# Patient Record
Sex: Male | Born: 1995 | Race: White | Hispanic: No | Marital: Single | Smoking: Never smoker
Health system: Southern US, Community
[De-identification: ages and names within clinical notes are randomized; demographics above are authoritative.]

## PROBLEM LIST (undated history)

## (undated) DIAGNOSIS — J45909 Unspecified asthma, uncomplicated: Secondary | ICD-10-CM

## (undated) DIAGNOSIS — G43D0 Abdominal migraine, not intractable: Secondary | ICD-10-CM

## (undated) DIAGNOSIS — L709 Acne, unspecified: Secondary | ICD-10-CM

## (undated) DIAGNOSIS — K219 Gastro-esophageal reflux disease without esophagitis: Secondary | ICD-10-CM

## (undated) HISTORY — PX: OTHER SURGICAL HISTORY: SHX169

## (undated) HISTORY — PX: ADENOIDECTOMY: SUR15

## (undated) HISTORY — PX: MYRINGOTOMY: SUR874

## (undated) HISTORY — DX: Unspecified asthma, uncomplicated: J45.909

## (undated) HISTORY — DX: Gastro-esophageal reflux disease without esophagitis: K21.9

---
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
## 2016-07-03 ENCOUNTER — Ambulatory Visit (INDEPENDENT_AMBULATORY_CARE_PROVIDER_SITE_OTHER): Payer: Managed Care, Other (non HMO) | Admitting: Family Medicine

## 2016-07-03 ENCOUNTER — Encounter: Payer: Self-pay | Admitting: Family Medicine

## 2016-07-03 VITALS — BP 118/68 | HR 83 | Temp 98.3°F | Ht 70.0 in | Wt 167.2 lb

## 2016-07-03 DIAGNOSIS — M542 Cervicalgia: Secondary | ICD-10-CM | POA: Insufficient documentation

## 2016-07-03 DIAGNOSIS — S161XXA Strain of muscle, fascia and tendon at neck level, initial encounter: Secondary | ICD-10-CM | POA: Diagnosis not present

## 2016-07-03 DIAGNOSIS — K219 Gastro-esophageal reflux disease without esophagitis: Secondary | ICD-10-CM

## 2016-07-03 DIAGNOSIS — G43009 Migraine without aura, not intractable, without status migrainosus: Secondary | ICD-10-CM | POA: Diagnosis not present

## 2016-07-03 DIAGNOSIS — F951 Chronic motor or vocal tic disorder: Secondary | ICD-10-CM

## 2016-07-03 DIAGNOSIS — Z8709 Personal history of other diseases of the respiratory system: Secondary | ICD-10-CM

## 2016-07-03 DIAGNOSIS — R3915 Urgency of urination: Secondary | ICD-10-CM | POA: Diagnosis not present

## 2016-07-03 LAB — POC URINALSYSI DIPSTICK (AUTOMATED)
Bilirubin, UA: NEGATIVE
Blood, UA: NEGATIVE
Glucose, UA: NEGATIVE
Ketones, UA: NEGATIVE
Leukocytes, UA: NEGATIVE
NITRITE UA: NEGATIVE
PROTEIN UA: NEGATIVE
SPEC GRAV UA: 1.025
UROBILINOGEN UA: 0.2
pH, UA: 7

## 2016-07-03 MED ORDER — MELOXICAM 15 MG PO TABS: 15.0000 mg | ORAL_TABLET | Freq: Every day | ORAL | 0 refills | Status: DC

## 2016-07-03 MED ORDER — CYCLOBENZAPRINE HCL 10 MG PO TABS: 5.0000 mg | ORAL_TABLET | Freq: Two times a day (BID) | ORAL | 0 refills | Status: DC | PRN

## 2016-07-03 MED ORDER — CYCLOBENZAPRINE HCL 10 MG PO TABS: 5.0000 mg | ORAL_TABLET | Freq: Every evening | ORAL | 0 refills | Status: DC | PRN

## 2016-07-03 NOTE — Assessment & Plan Note (Signed)
Discussed options. Consider referral to neuro for further treatment if stress reduction not helping.

## 2016-07-03 NOTE — Progress Notes (Signed)
Subjective:    Patient ID: Micheal Flores, male    DOB: 12-23-1995, 21 y.o.   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  PCP: [facility].  He has not been in last 3 years.   Dx by neurologist as child with Tourette's syndrome or motor tic disorder.  Occ slight jerk in past... Worse in last 2-3 months.  No vocal tic, no other movement ticks except occ blinking tics.  Now working and going to school full time.  Doing it several times an hour for 7-8 reps each btime.  Panning a wedding.  Has uncontrollable head movements.. Rolling head.  It is bothering a lot more with recent stress.   He has noted soreness now in left anterior neck. Pain with stretching neck back.  Sharp pain in back of neck with the movement as well.  No radiation of pain to arms. No weakness, no numbness. Stiff in morning when wakes up.  Has stretching, tried motrin for pain and associated tension headache.    Hx of migraine.Marland Kitchen only every 2-3 months.   Drinks a lot of water. Diet: poor diet, lots of junk food. Trying to decrease fast food in diet. Exercise: active at work   Work: maintenance at apartment complex.  Social History /Family History/Past Medical History reviewed and updated if needed.  Blood pressure 118/68, pulse 83, temperature 98.3 F (36.8 C), temperature source Oral, height 5\' 10"  (1.778 m), weight 167 lb 4 oz (75.9 kg). Body mass index is 24 kg/m.   Review of Systems  Constitutional: Negative for fatigue and fever.  HENT: Negative for ear pain.   Eyes: Negative for pain.  Respiratory: Negative for cough and shortness of breath.   Cardiovascular: Negative for chest pain, palpitations and leg swelling.  Gastrointestinal: Negative for abdominal pain.  Genitourinary: Positive for urgency. Negative for decreased urine volume, discharge, dysuria, flank pain, hematuria, penile pain, scrotal  swelling and testicular pain.       Trouble holding urine for long, no incontinence, occ has to push out urine.  Musculoskeletal: Negative for arthralgias.  Neurological: Positive for headaches. Negative for syncope and light-headedness.  Psychiatric/Behavioral: Negative for dysphoric mood.       Objective:   Physical Exam  Constitutional: He appears well-developed and well-nourished.  Non-toxic appearance. He does not appear ill. No distress.  HENT:  Head: Normocephalic and atraumatic.  Right Ear: Hearing, tympanic membrane, external ear and ear canal normal.  Left Ear: Hearing, tympanic membrane, external ear and ear canal normal.  Nose: Nose normal.  Mouth/Throat: Uvula is midline, oropharynx is clear and moist and mucous membranes are normal.  Eyes: Conjunctivae, EOM and lids are normal. Pupils are equal, round, and reactive to light. Lids are everted and swept, no foreign bodies found.  Neck: Trachea normal and phonation normal. Neck supple. Muscular tenderness present. No spinous process tenderness present. Carotid bruit is not present. Decreased range of motion present. No Brudzinski's sign and no Kernig's sign noted. No thyroid mass and no thyromegaly present.  Cardiovascular: Normal rate, regular rhythm, S1 normal, S2 normal, intact distal pulses and normal pulses.  Exam reveals no gallop.   No murmur heard. Pulmonary/Chest: Breath sounds normal. He has no wheezes. He has no rhonchi. He has no rales.  Abdominal: Soft. Normal appearance and bowel sounds are normal. There is no hepatosplenomegaly. There is no tenderness. There is  no rebound, no guarding and no CVA tenderness. No hernia.  Musculoskeletal:       Left shoulder: Normal.       Cervical back: He exhibits decreased range of motion and tenderness. He exhibits no bony tenderness and no swelling.  Lymphadenopathy:    He has no cervical adenopathy.  Neurological: He is alert. He has normal strength and normal reflexes. No  cranial nerve deficit or sensory deficit. Gait normal.  Skin: Skin is warm, dry and intact. No rash noted.  Psychiatric: He has a normal mood and affect. His speech is normal and behavior is normal. Judgment normal.          Assessment & Plan:

## 2016-07-03 NOTE — Addendum Note (Signed)
Addended by: Carter Kitten on: 07/03/2016 02:31 PM   Modules accepted: Orders

## 2016-07-03 NOTE — Progress Notes (Signed)
Pre visit review using our clinic review tool, if applicable. No additional management support is needed unless otherwise documented below in the visit note. 

## 2016-07-03 NOTE — Assessment & Plan Note (Signed)
Occurs every few weeks from food trigger. Using meds prn.

## 2016-07-03 NOTE — Assessment & Plan Note (Signed)
Treat with muscle relaxant prn, NSAIDs and home PT. Call if recurring for further options.

## 2016-07-03 NOTE — Patient Instructions (Addendum)
Working on healthy eating and  regular exercise.  Start muscle relaxant at night, use  meloxicam daily for pain and inflammation.  Start home PT.  Consider neuro referral for further treatment options for  Tourette's syndrome.

## 2016-07-03 NOTE — Assessment & Plan Note (Signed)
Occ every 2-3 months.

## 2016-10-09 ENCOUNTER — Ambulatory Visit (INDEPENDENT_AMBULATORY_CARE_PROVIDER_SITE_OTHER): Payer: Managed Care, Other (non HMO) | Admitting: Family Medicine

## 2016-10-09 ENCOUNTER — Encounter: Payer: Self-pay | Admitting: Family Medicine

## 2016-10-09 ENCOUNTER — Ambulatory Visit (INDEPENDENT_AMBULATORY_CARE_PROVIDER_SITE_OTHER)
Admission: RE | Admit: 2016-10-09 | Discharge: 2016-10-09 | Disposition: A | Discharge status: Home / Self Care | Payer: Managed Care, Other (non HMO) | Source: Ambulatory Visit | Attending: Family Medicine | Admitting: Family Medicine

## 2016-10-09 ENCOUNTER — Telehealth: Payer: Self-pay

## 2016-10-09 VITALS — BP 118/76 | HR 65 | Ht 70.0 in | Wt 179.0 lb

## 2016-10-09 DIAGNOSIS — R079 Chest pain, unspecified: Secondary | ICD-10-CM

## 2016-10-09 DIAGNOSIS — R0600 Dyspnea, unspecified: Secondary | ICD-10-CM | POA: Insufficient documentation

## 2016-10-09 MED ORDER — ALBUTEROL SULFATE HFA 108 (90 BASE) MCG/ACT IN AERS: 2.0000 | INHALATION_SPRAY | Freq: Four times a day (QID) | RESPIRATORY_TRACT | 2 refills | Status: DC | PRN

## 2016-10-09 NOTE — Telephone Encounter (Signed)
pts mother called last wk pt woke up gasping for breath x 1. Last night pt woke up gasping for breath and could not feel lower body but pt could walk; now no symptoms but wants eval. Pt cannot afford to go to UC or ED. Pt to see Dr Darnell Level 10/09/16 at 9:30.

## 2016-10-09 NOTE — Patient Instructions (Addendum)
This could be sleep apnea or other sleep trouble. We will refer you to sleep doctor for further evaluation - but try albuterol inhaler first. See Rosaria Ferries on your way out. Xray today. Possible asthma - try albuterol inhaler next time you have flare.

## 2016-10-09 NOTE — Assessment & Plan Note (Signed)
Describes PND, ESS = 8.  Not truly consistent with anxiety attacks (only happens at night, no prior h/o anxiety). ?asthma - PF today = 69% predicted. Rx albuterol inhaler PRN episode. If effective, may continue PRN. I also encouraged he remove new dog from bed - could be contributing.  ?GERD related - encouraged back off spicy foods. If no improvement with albuterol inhaler, they will contact me for sleep MD referral. Pt/mom agree with plan.

## 2016-10-09 NOTE — Assessment & Plan Note (Signed)
Check CXR to r/p PTX.

## 2016-10-09 NOTE — Progress Notes (Signed)
BP 118/76   Pulse 65   Ht 5\' 10"  (1.778 m)   Wt 179 lb (81.2 kg)   SpO2 98%   PF 300 L/min   BMI 25.68 kg/m    CC: apnea Subjective:    Patient ID: Micheal Flores, male    DOB: 10-04-1995, 21 y.o.   MRN: [id number]  HPI: Micheal Flores is a 21 y.o. male presenting on 10/09/2016 for Apnea (Pt c/o waking up in the middle of the night gasping for air x 2 weeks.  He states it takes 2-3 hours to recover.)   Here with mom.  Waking up at night with dyspnea and trouble breathing over last 2 weeks, this has happened 3 times. This is associated with cotton mouth, lightheadedness, nausea, sharp pains in chest. Latest episode was last night.   He may snore at night. No witnessed apneic episodes. Non restorative sleep. Some daytime somnolence - has been taking naps during the day because of fatigue. Ongoing trouble sleeping over last 2 yrs.   Denies fevers/chills, cough, abd pain, viral URI sxs.   Denies inciting trauma, injury, falls.  He does have intermittent GERD - eats high spicy foods.   No personal h/o anxiety.  Mom and sister with h/o panic attacks.  Mom did have some sleep apnea.  Known h/o tourette's. Taking meloxicam and flexeril about once weekly. Actually feels tourette's doing well - stress levels overall down.   Works at [employer].   With mom out of room denies alcohol, smoking, rec drugs.   H/o childhood asthma but he grew out of this  Relevant past medical, surgical, family and social history reviewed and updated as indicated. Interim medical history since our last visit reviewed. Allergies and medications reviewed and updated. Outpatient Medications Prior to Visit  Medication Sig Dispense Refill  . cyclobenzaprine (FLEXERIL) 10 MG tablet Take 0.5-1 tablets (5-10 mg total) by mouth at bedtime as needed for muscle spasms. 30 tablet 0  . meloxicam (MOBIC) 15 MG tablet Take 1 tablet (15 mg total) by mouth daily. 30 tablet 0   No  facility-administered medications prior to visit.      Per HPI unless specifically indicated in ROS section below Review of Systems     Objective:    BP 118/76   Pulse 65   Ht 5\' 10"  (1.778 m)   Wt 179 lb (81.2 kg)   SpO2 98%   PF 300 L/min   BMI 25.68 kg/m   Wt Readings from Last 3 Encounters:  10/09/16 179 lb (81.2 kg)  07/03/16 167 lb 4 oz (75.9 kg)    Physical Exam  Constitutional: He appears well-developed and well-nourished. No distress.  HENT:  Head: Normocephalic and atraumatic.  Right Ear: Hearing, tympanic membrane, external ear and ear canal normal.  Left Ear: Hearing, tympanic membrane, external ear and ear canal normal.  Nose: Nose normal. No mucosal edema or rhinorrhea. Right sinus exhibits no maxillary sinus tenderness and no frontal sinus tenderness. Left sinus exhibits no maxillary sinus tenderness and no frontal sinus tenderness.  Mouth/Throat: Uvula is midline, oropharynx is clear and moist and mucous membranes are normal. No oropharyngeal exudate, posterior oropharyngeal edema, posterior oropharyngeal erythema or tonsillar abscesses.  Eyes: Conjunctivae and EOM are normal. Pupils are equal, round, and reactive to light. No scleral icterus.  Neck: Normal range of motion. Neck supple.  Cardiovascular: Normal rate, regular rhythm, normal heart sounds and intact distal pulses.   No murmur heard. Pulmonary/Chest: Effort  normal and breath sounds normal. No respiratory distress. He has no wheezes. He has no rales. He exhibits tenderness (mild reproducible tenderness to palpation left 2nd costochondral junction).  Good air movement throughout  Abdominal: Soft. Bowel sounds are normal. He exhibits no distension. There is no tenderness. There is no rebound.  Musculoskeletal: He exhibits no edema.  Lymphadenopathy:    He has no cervical adenopathy.  Skin: Skin is warm and dry. No rash noted.  Psychiatric: He has a normal mood and affect. His behavior is normal.  Thought content normal.  Nursing note and vitals reviewed.  Expected PF = 650 L/min Actual PF = 450 L/min 69% predicted.     Assessment & Plan:   Problem List Items Addressed This Visit    Chest pain    Check CXR to r/p PTX.       Relevant Orders   DG Chest 2 View (Completed)   Paroxysmal nocturnal dyspnea - Primary    Describes PND, ESS = 8.  Not truly consistent with anxiety attacks (only happens at night, no prior h/o anxiety). ?asthma - PF today = 69% predicted. Rx albuterol inhaler PRN episode. If effective, may continue PRN. I also encouraged he remove new dog from bed - could be contributing.  ?GERD related - encouraged back off spicy foods. If no improvement with albuterol inhaler, they will contact me for sleep MD referral. Pt/mom agree with plan.           Follow up plan: Return if symptoms worsen or fail to improve.  Ria Bush, MD

## 2016-10-09 NOTE — Telephone Encounter (Signed)
Seen today. 

## 2017-03-27 ENCOUNTER — Ambulatory Visit: Payer: Managed Care, Other (non HMO) | Admitting: Family Medicine

## 2017-03-27 ENCOUNTER — Encounter: Payer: Self-pay | Admitting: Family Medicine

## 2017-03-27 VITALS — BP 120/80 | HR 90 | Temp 99.4°F | Wt 191.9 lb

## 2017-03-27 DIAGNOSIS — R05 Cough: Secondary | ICD-10-CM

## 2017-03-27 DIAGNOSIS — R059 Cough, unspecified: Secondary | ICD-10-CM

## 2017-03-27 MED ORDER — BENZONATATE 200 MG PO CAPS: 200.0000 mg | ORAL_CAPSULE | Freq: Three times a day (TID) | ORAL | 0 refills | Status: AC | PRN

## 2017-03-27 NOTE — Progress Notes (Signed)
Subjective:     Patient ID: Micheal Flores, male   DOB: Jul 11, 1995, 21 y.o.   MRN: [id number]  HPI Patient sees a work in with 2 week history of cough which has been productive of some clear sputum to occasionally colored sputum. He did some type of telemedicine interaction and was start on Zithromax 5 days ago. Has reported history of mild intermittent asthma. He has an albuterol inhaler but does not take regularly. He has history of Tourette's syndrome.  Had some nasal congestion. He thinks he may be having some mild wheezing intermittently. No nausea or vomiting. Tried some type of over-the-counter cough drops without much improvement  Past Medical History:  Diagnosis Date  . Abdominal migraine   . Acne   . Asthma   . GERD (gastroesophageal reflux disease)   . Migraine    Past Surgical History:  Procedure Laterality Date  . ACNOID    . ADENOIDECTOMY    . MYRINGOTOMY      reports that  has never smoked. he has never used smokeless tobacco. He reports that he does not drink alcohol or use drugs. family history includes Alcohol abuse in his father; Mental illness in his mother. No Known Allergies   Review of Systems  Constitutional: Negative for appetite change, chills and unexpected weight change.  Respiratory: Positive for cough.        Objective:   Physical Exam  Constitutional: He appears well-developed and well-nourished.  HENT:  Mouth/Throat: Oropharynx is clear and moist.  Neck: Neck supple.  Cardiovascular: Normal rate.  Pulmonary/Chest: Effort normal and breath sounds normal. No respiratory distress. He has no wheezes. He has no rales.  Musculoskeletal: He exhibits no edema.  Lymphadenopathy:    He has no cervical adenopathy.       Assessment:     Cough. Patient has reported history of mild intermittent asthma. No wheezing noted on exam. No respiratory distress. Question viral.    Plan:     -Finish out Zithromax -Tessalon 200 mg every 8 hours as  needed for cough -Continue albuterol as needed -Follow-up promptly for any fever or worsening symptoms  Eulas Post MD [facility]

## 2017-03-27 NOTE — Patient Instructions (Signed)
Follow up for any fever or increased shortness of breath Consider OTC [place] cough drops.

## 2018-02-25 ENCOUNTER — Encounter (HOSPITAL_COMMUNITY): Payer: Self-pay | Admitting: Emergency Medicine

## 2018-02-25 ENCOUNTER — Emergency Department (HOSPITAL_COMMUNITY)
Admission: EM | Admit: 2018-02-25 | Discharge: 2018-02-25 | Disposition: A | Discharge status: Home / Self Care | Payer: Managed Care, Other (non HMO) | Attending: Emergency Medicine | Admitting: Emergency Medicine

## 2018-02-25 ENCOUNTER — Other Ambulatory Visit: Payer: Self-pay

## 2018-02-25 DIAGNOSIS — R51 Headache: Secondary | ICD-10-CM | POA: Diagnosis present

## 2018-02-25 DIAGNOSIS — J45909 Unspecified asthma, uncomplicated: Secondary | ICD-10-CM | POA: Diagnosis not present

## 2018-02-25 DIAGNOSIS — G43101 Migraine with aura, not intractable, with status migrainosus: Secondary | ICD-10-CM

## 2018-02-25 MED ORDER — SUMATRIPTAN SUCCINATE 50 MG PO TABS
50.0000 mg | ORAL_TABLET | Freq: Once | ORAL | Status: AC
  Administered 2018-02-25: 50 mg via ORAL
  Filled 2018-02-25: qty 1

## 2018-02-25 MED ORDER — IBUPROFEN 400 MG PO TABS
400.0000 mg | ORAL_TABLET | Freq: Once | ORAL | Status: AC
  Administered 2018-02-25: 400 mg via ORAL
  Filled 2018-02-25: qty 1

## 2018-02-25 MED ORDER — SUMATRIPTAN SUCCINATE 6 MG/0.5ML ~~LOC~~ SOLN: 6.0000 mg | Freq: Once | SUBCUTANEOUS | Status: DC

## 2018-02-25 MED ORDER — METOCLOPRAMIDE HCL 5 MG/ML IJ SOLN: 10.0000 mg | Freq: Once | INTRAMUSCULAR | Status: DC

## 2018-02-25 NOTE — ED Provider Notes (Signed)
Patient placed in Quick Look pathway, seen and evaluated   Chief Complaint: Headache and visual change.  HPI:   Micheal Flores is a 22 y.o. male with a history of migraines, who presents to the emergency department after an episode where he had a 15-minute period of loss of the outer portion of his left visual field as well as some numbness around the outer portion of the left periorbital area, these visual symptoms completely resolved and then patient developed a migraine-like headache with associated nausea, and light sensitivity.  He reports history of migraines and had abdominal migraines as a child, has seen neurology in the past but has not been on any medications for migraines over the past 3 years.  He has not taken anything for pain prior to arrival.  No current neurologic deficits.  ROS: + headache, vision change, facial numbness, nausea, light sensitivity. -Fever, neck pain, neck stiffness, numbness, weakness, dizziness, change in speech or swallowing  Physical Exam:   Gen: No distress  Neuro: Awake and Alert  Skin: Warm    Focused Exam: Speech is clear, able to follow commands CN III-XII intact Normal strength in upper and lower extremities bilaterally including dorsiflexion and plantar flexion, strong and equal grip strength Sensation normal to light and sharp touch Moves extremities without ataxia, coordination intact Normal finger to nose and rapid alternating movements No pronator drift     Initiation of care has begun. The patient has been counseled on the process, plan, and necessity for staying for the completion/evaluation, and the remainder of the medical screening examination    Janet Berlin 02/25/18 1640    Mesner, Corene Cornea, MD 02/25/18 2253

## 2018-02-25 NOTE — ED Notes (Signed)
Reviewed d/c instructions with pt, who verbalized understanding and had no outstanding questions. Pt departed in NAD, refused use of wheelchair.   

## 2018-02-25 NOTE — Discharge Instructions (Signed)
It was our pleasure to provide your ER care today - we hope that you feel better.  Rest. Drink plenty of fluids.  Take excedrin migraine as need.   Follow up with primary care doctor in the next few days if symptoms fail to resolve. For your migraines, you may also follow up with neurologist - see attached info - call to arrange appointment.   Return to ER if worse, new symptoms, intractable headache, persistent vomiting, new numbness/weakness, change in speech or vision, or other concern.

## 2018-02-25 NOTE — ED Provider Notes (Signed)
[place] EMERGENCY DEPARTMENT Provider Note   CSN: [id number] Arrival date & time: 02/25/18  1609     History   Chief Complaint Chief Complaint  Patient presents with  . Migraine  . Visual Field Change    HPI Micheal Flores is a 22 y.o. male.  Patient c/o 'migraine headache' onset today, approximately 3 hours ago. States hx migraines. Indicates initial had blurry vision of the lateral 3/4 of the visual field of left eye with tingling sensation about the eye. Those symptoms lasted a few minutes. Pt then notes onset of a typical, dull, throbbing headache c/w his prior migraines. Mild light sensitivity also c/w prior headaches. +nausea. No vomiting. No neck pain or stiffness. No change in speech. No numbness/weakness or loss of normal functional ability. Visual symptoms and numbness/tingling sensation about eye have completely resolved.   The history is provided by the patient.  Migraine  Associated symptoms include headaches. Pertinent negatives include no chest pain, no abdominal pain and no shortness of breath.    Past Medical History:  Diagnosis Date  . Abdominal migraine   . Acne   . Asthma   . GERD (gastroesophageal reflux disease)   . Migraine     Patient Active Problem List   Diagnosis Date Noted  . Chest pain 10/09/2016  . Paroxysmal nocturnal dyspnea 10/09/2016  . 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 Acute neck pain 07/03/2016  . History of asthma 07/03/2016  . Migraine headache without aura 07/03/2016  . GERD (gastroesophageal reflux disease) 07/03/2016    Past Surgical History:  Procedure Laterality Date  . ACNOID    . ADENOIDECTOMY    . MYRINGOTOMY          Home Medications    Prior to Admission medications   Medication Sig Start Date End Date Taking? Authorizing Provider  aspirin-acetaminophen-caffeine (EXCEDRIN MIGRAINE) [phone] MG tablet Take 2 tablets by mouth daily as needed for headache.   Yes [provider]  benzonatate (TESSALON) 200 MG capsule Take 1 capsule (200 mg total) by mouth 3 (three) times daily as needed. Patient not taking: Reported on 02/25/2018 03/27/17   Eulas Post, MD    Family History Family History  Problem Relation Age of Onset  . Mental illness Mother   . Alcohol abuse Father     Social History Social History   Tobacco Use  . Smoking status: Never Smoker  . Smokeless tobacco: Never Used  Substance Use Topics  . Alcohol use: No  . Drug use: No     Allergies   Patient has no known allergies.   Review of Systems Review of Systems  Constitutional: Negative for fever.  HENT: Negative for congestion and sinus pain.   Eyes: Negative for pain and redness.  Respiratory: Negative for shortness of breath.   Cardiovascular: Negative for chest pain.  Gastrointestinal: Positive for nausea. Negative for abdominal pain.  Genitourinary: Negative for flank pain.  Musculoskeletal: Negative for neck pain and neck stiffness.  Skin: Negative for rash.  Neurological: Positive for headaches. Negative for speech difficulty.  Hematological: Does not bruise/bleed easily.  Psychiatric/Behavioral: Negative for confusion.     Physical Exam Updated Vital Signs BP (!) 151/95 (BP Location: Right Arm)   Pulse 83   Temp 98.6 F (37 C) (Oral)   Resp 18   SpO2 100%   Physical Exam  Constitutional: He is oriented to person, place, and time. He appears well-developed and well-nourished.  HENT:  Head: Atraumatic.  Mouth/Throat: Oropharynx is clear and moist.  No sinus or temporal tenderness.   Eyes: Pupils are equal, round, and reactive to light. Conjunctivae and EOM are normal.  Fundoscopic exam unremarkable, sharp disc margins.   Neck: Neck supple. No tracheal deviation present.  No stiffness or rigidity.   Cardiovascular: Normal rate.  Pulmonary/Chest: Effort normal. No accessory muscle usage. No respiratory distress.  Abdominal: He exhibits no distension.    Musculoskeletal: He exhibits no edema.  Neurological: He is alert and oriented to person, place, and time. No cranial nerve deficit.  Speech clear/fluent. No pronator drift. Motor 5/5 bil, sens grossly intact. Steady gait.   Skin: Skin is warm and dry.  Psychiatric: He has a normal mood and affect.  Nursing note and vitals reviewed.    ED Treatments / Results  Labs (all labs ordered are listed, but only abnormal results are displayed) Labs Reviewed - No data to display  EKG None  Radiology No results found.  Procedures Procedures (including critical care time)  Medications Ordered in ED Medications  SUMAtriptan (IMITREX) tablet 50 mg (has no administration in time range)  ibuprofen (ADVIL,MOTRIN) tablet 400 mg (has no administration in time range)     Initial Impression / Assessment and Plan / ED Course  I have reviewed the triage vital signs and the nursing notes.  Pertinent labs & imaging results that were available during my care of the patient were reviewed by me and considered in my medical decision making (see chart for details).  Initial meds ordered - pt requests po meds.   imitrex po. Motrin po. Po fluids.  Reviewed nursing notes and prior charts for additional history.   Visual symptoms remain completely resolved, and headache improved.    Final Clinical Impressions(s) / ED Diagnoses   Final diagnoses:  None    ED Discharge Orders    None       Lajean Saver, MD 02/25/18 1840

## 2018-02-25 NOTE — ED Triage Notes (Signed)
Pt was at the eye doctor when he was on his phone and noticed his vision went blurry to the left eye and his "eye socket went numb." then his vision returned to normal and then he had a migraine. He now has a migraine and nausea. Pt has a hx of migraines. Pt has not taken anything for pain. Pt usually takes migraine medication or motrin for them. No neuro deficits.

## 2021-01-23 ENCOUNTER — Other Ambulatory Visit: Payer: Self-pay

## 2021-01-23 ENCOUNTER — Ambulatory Visit: Payer: Self-pay | Admitting: Dermatology

## 2021-01-23 DIAGNOSIS — D229 Melanocytic nevi, unspecified: Secondary | ICD-10-CM

## 2021-01-23 DIAGNOSIS — D225 Melanocytic nevi of trunk: Secondary | ICD-10-CM

## 2021-01-23 DIAGNOSIS — Z1283 Encounter for screening for malignant neoplasm of skin: Secondary | ICD-10-CM

## 2021-01-23 DIAGNOSIS — L578 Other skin changes due to chronic exposure to nonionizing radiation: Secondary | ICD-10-CM

## 2021-01-23 NOTE — Patient Instructions (Signed)

## 2021-01-23 NOTE — Progress Notes (Signed)
   Follow-Up Visit   Subjective  Micheal Flores is a 25 y.o. male who presents for the following: Annual Exam (No history of abnormal moles or skin cancer - TBSE today). The patient presents for Total-Body Skin Exam (TBSE) for skin cancer screening and mole check.  The following portions of the chart were reviewed this encounter and updated as appropriate:   Tobacco  Allergies  Meds  Problems  Med Hx  Surg Hx  Fam Hx     Review of Systems:  No other skin or systemic complaints except as noted in HPI or Assessment and Plan.  Objective  Well appearing patient in no apparent distress; mood and affect are within normal limits.  A full examination was performed including scalp, head, eyes, ears, nose, lips, neck, chest, axillae, abdomen, back, buttocks, bilateral upper extremities, bilateral lower extremities, hands, feet, fingers, toes, fingernails, and toenails. All findings within normal limits unless otherwise noted below.  Left medial periareolar, right upper back 4.0 cm lat to spine medial to inf scapular, left inf lat scapula, right upper back/post shoulder 1.1 cm brown macule of left medial periareolar. 0.7 cm brown macule right upper back 4.0 cm lat to spine medial to inf scapular 1.0 x 0.3 cm brown macule left inf lat scapula  0.6 cm brown macule right upper back/post shoulder              Assessment & Plan   Actinic Damage - chronic, secondary to cumulative UV radiation exposure/sun exposure over time - diffuse scaly erythematous macules with underlying dyspigmentation - Recommend daily broad spectrum sunscreen SPF 30+ to sun-exposed areas, reapply every 2 hours as needed.  - Recommend staying in the shade or wearing long sleeves, sun glasses (UVA+UVB protection) and wide brim hats (4-inch brim around the entire circumference of the hat). - Call for new or changing lesions.  Melanocytic Nevi - Tan-brown and/or pink-flesh-colored symmetric macules and  papules - Benign appearing on exam today - Observation - Call clinic for new or changing moles - Recommend daily use of broad spectrum spf 30+ sunscreen to sun-exposed areas.   Nevus - some slightly irregular Left medial periareolar, right upper back 4.0 cm lat to spine medial to inf scapular, left inf lat scapula, right upper back/post shoulder Pt has not noticed any change in these and they have been present for years. Benign appearing, observe.  Skin cancer screening  Return in about 1-2 years (around 01/24/2023) for TBSE.  I, Ashok Cordia, CMA, am acting as scribe for Micheal Ser, MD . Documentation: I have reviewed the above documentation for accuracy and completeness, and I agree with the above.  Micheal Ser, MD

## 2021-01-24 ENCOUNTER — Encounter: Payer: Self-pay | Admitting: Dermatology

## 2023-10-07 ENCOUNTER — Encounter: Admitting: Dermatology
